# Patient Record
Sex: Male | Born: 1945 | Race: White | Hispanic: No | Marital: Married | Smoking: Current every day smoker
Health system: Southern US, Community
[De-identification: ages and names within clinical notes are randomized; demographics above are authoritative.]

## PROBLEM LIST (undated history)

## (undated) DIAGNOSIS — Z973 Presence of spectacles and contact lenses: Secondary | ICD-10-CM

## (undated) DIAGNOSIS — H9190 Unspecified hearing loss, unspecified ear: Secondary | ICD-10-CM

## (undated) DIAGNOSIS — T8859XA Other complications of anesthesia, initial encounter: Secondary | ICD-10-CM

## (undated) DIAGNOSIS — N200 Calculus of kidney: Secondary | ICD-10-CM

## (undated) DIAGNOSIS — T4145XA Adverse effect of unspecified anesthetic, initial encounter: Secondary | ICD-10-CM

## (undated) HISTORY — PX: OTHER SURGICAL HISTORY: SHX169

## (undated) HISTORY — PX: BACK SURGERY: SHX140

## (undated) HISTORY — PX: CERVICAL FUSION: SHX112

## (undated) HISTORY — PX: LASIK: SHX215

## (undated) HISTORY — PX: JOINT REPLACEMENT: SHX530

## (undated) HISTORY — PX: COLONOSCOPY W/ BIOPSIES AND POLYPECTOMY: SHX1376

## (undated) HISTORY — PX: APPENDECTOMY: SHX54

---

## 2010-01-22 ENCOUNTER — Ambulatory Visit (HOSPITAL_COMMUNITY): Payer: Self-pay | Admitting: Psychology

## 2010-02-05 ENCOUNTER — Ambulatory Visit (HOSPITAL_COMMUNITY): Payer: Self-pay | Admitting: Psychology

## 2012-10-21 ENCOUNTER — Encounter: Payer: Self-pay | Admitting: Emergency Medicine

## 2012-10-21 ENCOUNTER — Emergency Department (INDEPENDENT_AMBULATORY_CARE_PROVIDER_SITE_OTHER)
Admission: EM | Admit: 2012-10-21 | Discharge: 2012-10-21 | Disposition: A | Discharge status: Home / Self Care | Payer: Medicare Other | Source: Home / Self Care | Attending: Family Medicine | Admitting: Family Medicine

## 2012-10-21 DIAGNOSIS — S51809A Unspecified open wound of unspecified forearm, initial encounter: Secondary | ICD-10-CM

## 2012-10-21 DIAGNOSIS — S40022A Contusion of left upper arm, initial encounter: Secondary | ICD-10-CM

## 2012-10-21 DIAGNOSIS — S40029A Contusion of unspecified upper arm, initial encounter: Secondary | ICD-10-CM

## 2012-10-21 DIAGNOSIS — IMO0002 Reserved for concepts with insufficient information to code with codable children: Secondary | ICD-10-CM
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
## 2014-05-03 ENCOUNTER — Encounter (HOSPITAL_COMMUNITY): Payer: Self-pay

## 2014-05-03 ENCOUNTER — Encounter (HOSPITAL_COMMUNITY)
Admission: RE | Admit: 2014-05-03 | Discharge: 2014-05-03 | Disposition: A | Discharge status: Home / Self Care | Payer: Medicare Other | Source: Ambulatory Visit | Attending: Orthopedic Surgery | Admitting: Orthopedic Surgery

## 2014-05-03 DIAGNOSIS — M961 Postlaminectomy syndrome, not elsewhere classified: Secondary | ICD-10-CM | POA: Diagnosis not present

## 2014-05-03 DIAGNOSIS — F17210 Nicotine dependence, cigarettes, uncomplicated: Secondary | ICD-10-CM | POA: Diagnosis not present

## 2014-05-03 DIAGNOSIS — Y838 Other surgical procedures as the cause of abnormal reaction of the patient, or of later complication, without mention of misadventure at the time of the procedure: Secondary | ICD-10-CM | POA: Diagnosis not present

## 2014-05-03 DIAGNOSIS — Z01812 Encounter for preprocedural laboratory examination: Secondary | ICD-10-CM | POA: Diagnosis not present

## 2014-05-03 DIAGNOSIS — Z981 Arthrodesis status: Secondary | ICD-10-CM | POA: Diagnosis not present

## 2014-05-03 DIAGNOSIS — R05 Cough: Secondary | ICD-10-CM | POA: Diagnosis not present

## 2014-05-03 DIAGNOSIS — R0683 Snoring: Secondary | ICD-10-CM | POA: Diagnosis not present

## 2014-05-03 DIAGNOSIS — Z01818 Encounter for other preprocedural examination: Secondary | ICD-10-CM | POA: Diagnosis not present

## 2014-05-03 DIAGNOSIS — R0600 Dyspnea, unspecified: Secondary | ICD-10-CM | POA: Diagnosis not present

## 2014-05-03 DIAGNOSIS — G894 Chronic pain syndrome: Secondary | ICD-10-CM | POA: Diagnosis present

## 2014-05-03 HISTORY — DX: Calculus of kidney: N20.0

## 2014-05-03 HISTORY — DX: Other complications of anesthesia, initial encounter: T88.59XA

## 2014-05-03 HISTORY — DX: Presence of spectacles and contact lenses: Z97.3

## 2014-05-03 HISTORY — DX: Adverse effect of unspecified anesthetic, initial encounter: T41.45XA

## 2014-05-03 HISTORY — DX: Unspecified hearing loss, unspecified ear: H91.90

## 2014-05-03 LAB — BASIC METABOLIC PANEL
ANION GAP: 8 (ref 5–15)
BUN: 12 mg/dL (ref 6–23)
CO2: 25 mmol/L (ref 19–32)
Calcium: 8.9 mg/dL (ref 8.4–10.5)
Chloride: 106 mmol/L (ref 96–112)
Creatinine, Ser: 1.52 mg/dL — ABNORMAL HIGH (ref 0.50–1.35)
GFR, EST AFRICAN AMERICAN: 53 mL/min — AB (ref >90)
GFR, EST NON AFRICAN AMERICAN: 46 mL/min — AB (ref >90)
Glucose, Bld: 110 mg/dL — ABNORMAL HIGH (ref 70–99)
POTASSIUM: 4.4 mmol/L (ref 3.5–5.1)
SODIUM: 139 mmol/L (ref 135–145)

## 2014-05-03 LAB — CBC
HCT: 37.7 % — ABNORMAL LOW (ref 39.0–52.0)
Hemoglobin: 12.8 g/dL — ABNORMAL LOW (ref 13.0–17.0)
MCH: 32.3 pg (ref 26.0–34.0)
MCHC: 34 g/dL (ref 30.0–36.0)
MCV: 95.2 fL (ref 78.0–100.0)
Platelets: 136 10*3/uL — ABNORMAL LOW (ref 150–400)
RBC: 3.96 MIL/uL — ABNORMAL LOW (ref 4.22–5.81)
RDW: 13.2 % (ref 11.5–15.5)
WBC: 5.2 10*3/uL (ref 4.0–10.5)

## 2014-05-03 LAB — SURGICAL PCR SCREEN
MRSA, PCR: NEGATIVE
STAPHYLOCOCCUS AUREUS: NEGATIVE

## 2014-05-03 MED ORDER — ACETAMINOPHEN 10 MG/ML IV SOLN
1000.0000 mg | Freq: Four times a day (QID) | INTRAVENOUS | Status: DC
  Administered 2014-05-04: 1000 mg via INTRAVENOUS

## 2014-05-03 MED ORDER — CEFAZOLIN SODIUM-DEXTROSE 2-3 GM-% IV SOLR
2.0000 g | INTRAVENOUS | Status: AC
  Administered 2014-05-04: 2 g via INTRAVENOUS
  Filled 2014-05-03: qty 50

## 2014-05-03 MED ORDER — DEXAMETHASONE SODIUM PHOSPHATE 4 MG/ML IJ SOLN
4.0000 mg | Freq: Once | INTRAMUSCULAR | Status: DC
  Filled 2014-05-03: qty 1

## 2014-05-03 NOTE — Progress Notes (Signed)
Pt PCR results from this AM still pending; called lab. Please treat pt DOS if positive.

## 2014-05-03 NOTE — Progress Notes (Signed)
Pt denies SOB, chest pain, and being under the care of a cardiologist. Pt stated that a stress test was done but he can't remember where and when; records were requested from PCP Julie SwazilandJordan , [facility]. Revonda StandardAllison, PA ( anesthesia) made aware that pt stated that he cannot move his neck from left to right greater than 20 degrees and that Dr. Shon BatonBrooks took an x ray of the neck because the hardware from a cervical fusion rubs on his esophagus. See clearance and documentation on chart.

## 2014-05-03 NOTE — Progress Notes (Signed)
Anesthesia Chart Review:  Patient is a 69 year old male scheduled for lumbar spinal cord stimulator insertion on 05/04/14 by Dr. Shon BatonBrooks.  History includes smoking, hard of hearing, ACDF/cervial fusion X 3, back surgery, nephrolithiasis, bilateral TKA. [facility] by Julie SwazilandJordan, NP for follow-up and pre-operative evaluation and was felt acceptable risk. EKG and CXR done at that time (see below).  Anesthesia concerns include limited neck mobility particularly to the sides and also issues with dysphagia felt likely due to cervical fusion plate positioning.  He reported that Dr. Shon BatonBrooks did some imaging of his neck or esophagus.  I contacted Cordelia PenSherry at his office. She reported that there is not an official report, but that Dr. Shon BatonBrooks discussed results in his 04/25/14 office note.  It stated, "X-rays today do not show any significant change from 3 years ago but the inferior aspect of his plate has migrated off. He has had multiple swallow studies for chronic dysphasia, most likely due to the positioning of his plate. 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 Normal ECG.  04/27/14 CXR: FINDINGS: Compare 01/11/13: Normal heart size. 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 No new infiltrate or effusion. Prior resection left distal clavicle and cervical spine fusion. IMPRESSION: No acute changes. 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 Evidence of prior surgery.   03/15/12 MRI c-spine (Care Everywhere):  FINDINGS: The patient is status post ACDF C3-C6. Posterior fusion C1 and C2. The spinal cord appears normal. The visualized posterior fossa is unremarkable. The cord is visualized to the T3 level. Artifact from the hardware mildly degrades the image quality. The foramen magnum have a normal appearance. Axial imaging C2-C3: No evidence of cord compression. Also, no obvious  nerve root compression. C3-C4: There is little in the way of remaining disc material. Posterior lateral osteophytes are present, right greater than left with bilateral foraminal encroachment. C4-C5: No significant residual disc material is seen. There is severe foraminal stenosis on the right from osteophyte formation.  C5-C6: Again, little residual disc material is seen status post fusion. Severe right foraminal encroachment from osteophyte formation. C6-C7: No significant abnormality. C7-T1: Negative T1-T2: Negative  IMPRESSION: The main abnormality is severe foraminal encroachment on the right, C3-C4 through C5-C6. There is no evidence of cord compression.   Preoperative labs noted. Cr 1.52 (Previous Cr was 1.45 on 01/20/14 in Care Everywhere). H/H 12.8/37.7, PLY 136K.  Due to limited neck mobility and c-spine anatomy he may need his neck in a neutral position with use of advanced airway techniques.  Definitive anesthesia plan following anesthesiologist evaluation on the day of surgery.  Velna Ochsllison Aija Scarfo, PA-C [facility]iology Phone [phone] 05/03/2014 12:09 PM

## 2014-05-03 NOTE — Anesthesia Preprocedure Evaluation (Addendum)
Anesthesia Evaluation  Patient identified by MRN, date of birth, ID band Patient awake    Reviewed: Allergy & Precautions, NPO status , Patient's Chart, lab work & pertinent test results  Airway        Dental   Pulmonary Current Smoker (25 pack year),          Cardiovascular negative cardio ROS      Neuro/Psych    GI/Hepatic negative GI ROS, Neg liver ROS,   Endo/Other  negative endocrine ROS  Renal/GU Creat 1.5     Musculoskeletal   Abdominal   Peds  Hematology 12/37   Anesthesia Other Findings   Reproductive/Obstetrics                           Anesthesia Physical Anesthesia Plan  ASA: II  Anesthesia Plan: General   Post-op Pain Management:    Induction: Intravenous  Airway Management Planned: Oral ETT and Video Laryngoscope Planned  Additional Equipment:   Intra-op Plan:   Post-operative Plan:   Informed Consent: I have reviewed the patients History and Physical, chart, labs and discussed the procedure including the risks, benefits and alternatives for the proposed anesthesia with the patient or authorized representative who has indicated his/her understanding and acceptance.     Plan Discussed with:   Anesthesia Plan Comments: (See my note regarding anesthesia concerns. Shonna ChockAllison Zelenak, PA-C, multimodal pain RX,  Has reported some reduced neck mobility, Glide available)      Anesthesia Quick Evaluation

## 2014-05-04 ENCOUNTER — Observation Stay (HOSPITAL_COMMUNITY)
Admission: RE | Admit: 2014-05-04 | Discharge: 2014-05-05 | Disposition: A | Discharge status: Home / Self Care | Payer: Medicare Other | Source: Ambulatory Visit | Attending: Orthopedic Surgery | Admitting: Orthopedic Surgery

## 2014-05-04 ENCOUNTER — Inpatient Hospital Stay (HOSPITAL_COMMUNITY): Payer: Medicare Other | Admitting: Anesthesiology

## 2014-05-04 ENCOUNTER — Encounter (HOSPITAL_COMMUNITY)
Admission: RE | Disposition: A | Discharge status: Home / Self Care | Payer: Medicare Other | Source: Ambulatory Visit | Attending: Orthopedic Surgery

## 2014-05-04 ENCOUNTER — Inpatient Hospital Stay (HOSPITAL_COMMUNITY): Payer: Medicare Other | Admitting: Vascular Surgery

## 2014-05-04 ENCOUNTER — Inpatient Hospital Stay (HOSPITAL_COMMUNITY): Payer: Medicare Other

## 2014-05-04 ENCOUNTER — Encounter (HOSPITAL_COMMUNITY): Payer: Self-pay

## 2014-05-04 DIAGNOSIS — R0683 Snoring: Secondary | ICD-10-CM | POA: Insufficient documentation

## 2014-05-04 DIAGNOSIS — M961 Postlaminectomy syndrome, not elsewhere classified: Principal | ICD-10-CM | POA: Insufficient documentation

## 2014-05-04 DIAGNOSIS — Z01818 Encounter for other preprocedural examination: Secondary | ICD-10-CM | POA: Insufficient documentation

## 2014-05-04 DIAGNOSIS — Z01812 Encounter for preprocedural laboratory examination: Secondary | ICD-10-CM | POA: Insufficient documentation

## 2014-05-04 DIAGNOSIS — Y838 Other surgical procedures as the cause of abnormal reaction of the patient, or of later complication, without mention of misadventure at the time of the procedure: Secondary | ICD-10-CM | POA: Insufficient documentation

## 2014-05-04 DIAGNOSIS — Z981 Arthrodesis status: Secondary | ICD-10-CM | POA: Diagnosis not present

## 2014-05-04 DIAGNOSIS — G894 Chronic pain syndrome: Secondary | ICD-10-CM | POA: Insufficient documentation

## 2014-05-04 DIAGNOSIS — G8929 Other chronic pain: Secondary | ICD-10-CM | POA: Diagnosis present

## 2014-05-04 DIAGNOSIS — F17210 Nicotine dependence, cigarettes, uncomplicated: Secondary | ICD-10-CM | POA: Insufficient documentation

## 2014-05-04 DIAGNOSIS — Z419 Encounter for procedure for purposes other than remedying health state, unspecified: Secondary | ICD-10-CM

## 2014-05-04 DIAGNOSIS — R0600 Dyspnea, unspecified: Secondary | ICD-10-CM | POA: Insufficient documentation

## 2014-05-04 DIAGNOSIS — R05 Cough: Secondary | ICD-10-CM | POA: Insufficient documentation

## 2014-05-04 HISTORY — PX: SPINAL CORD STIMULATOR INSERTION: SHX5378

## 2014-05-04 SURGERY — INSERTION, SPINAL CORD STIMULATOR, LUMBAR
Anesthesia: General | Site: Back

## 2014-05-04 MED ORDER — PROPOFOL 10 MG/ML IV BOLUS
INTRAVENOUS | Status: DC | PRN
  Administered 2014-05-04: 200 mg via INTRAVENOUS

## 2014-05-04 MED ORDER — DEXAMETHASONE SODIUM PHOSPHATE 4 MG/ML IJ SOLN
4.0000 mg | Freq: Four times a day (QID) | INTRAMUSCULAR | Status: DC
  Filled 2014-05-04 (×4): qty 1

## 2014-05-04 MED ORDER — SUCCINYLCHOLINE CHLORIDE 20 MG/ML IJ SOLN
INTRAMUSCULAR | Status: DC | PRN
  Administered 2014-05-04: 120 mg via INTRAVENOUS

## 2014-05-04 MED ORDER — MEPERIDINE HCL 25 MG/ML IJ SOLN: 6.2500 mg | INTRAMUSCULAR | Status: DC | PRN

## 2014-05-04 MED ORDER — LACTATED RINGERS IV SOLN
INTRAVENOUS | Status: DC
  Administered 2014-05-04: 12:00:00 via INTRAVENOUS

## 2014-05-04 MED ORDER — ONDANSETRON HCL 4 MG/2ML IJ SOLN
INTRAMUSCULAR | Status: DC | PRN
  Administered 2014-05-04 (×2): 4 mg via INTRAVENOUS

## 2014-05-04 MED ORDER — CEFAZOLIN SODIUM 1-5 GM-% IV SOLN
1.0000 g | Freq: Three times a day (TID) | INTRAVENOUS | Status: AC
  Administered 2014-05-04 – 2014-05-05 (×2): 1 g via INTRAVENOUS
  Filled 2014-05-04 (×2): qty 50

## 2014-05-04 MED ORDER — MENTHOL 3 MG MT LOZG: 1.0000 | LOZENGE | OROMUCOSAL | Status: DC | PRN

## 2014-05-04 MED ORDER — MIDAZOLAM HCL 2 MG/2ML IJ SOLN
INTRAMUSCULAR | Status: AC
  Filled 2014-05-04: qty 2

## 2014-05-04 MED ORDER — HEMOSTATIC AGENTS (NO CHARGE) OPTIME
TOPICAL | Status: DC | PRN
  Administered 2014-05-04: 1 via TOPICAL

## 2014-05-04 MED ORDER — FENTANYL CITRATE 0.05 MG/ML IJ SOLN: 25.0000 ug | INTRAMUSCULAR | Status: DC | PRN

## 2014-05-04 MED ORDER — SODIUM CHLORIDE 0.9 % IV SOLN: 250.0000 mL | INTRAVENOUS | Status: DC

## 2014-05-04 MED ORDER — ARTIFICIAL TEARS OP OINT
TOPICAL_OINTMENT | OPHTHALMIC | Status: DC | PRN
  Administered 2014-05-04: 1 via OPHTHALMIC

## 2014-05-04 MED ORDER — PROMETHAZINE HCL 25 MG/ML IJ SOLN: 6.2500 mg | INTRAMUSCULAR | Status: DC | PRN

## 2014-05-04 MED ORDER — FENTANYL CITRATE 0.05 MG/ML IJ SOLN
INTRAMUSCULAR | Status: DC | PRN
  Administered 2014-05-04: 50 ug via INTRAVENOUS
  Administered 2014-05-04: 100 ug via INTRAVENOUS
  Administered 2014-05-04 (×2): 50 ug via INTRAVENOUS

## 2014-05-04 MED ORDER — ACETAMINOPHEN 10 MG/ML IV SOLN
1000.0000 mg | Freq: Four times a day (QID) | INTRAVENOUS | Status: DC
  Administered 2014-05-04 – 2014-05-05 (×3): 1000 mg via INTRAVENOUS
  Filled 2014-05-04 (×4): qty 100

## 2014-05-04 MED ORDER — THROMBIN 20000 UNITS EX KIT
PACK | CUTANEOUS | Status: DC | PRN
  Administered 2014-05-04: 20 mL via TOPICAL

## 2014-05-04 MED ORDER — SODIUM CHLORIDE 0.9 % IJ SOLN
3.0000 mL | Freq: Two times a day (BID) | INTRAMUSCULAR | Status: DC
  Administered 2014-05-04: 3 mL via INTRAVENOUS

## 2014-05-04 MED ORDER — LACTATED RINGERS IV SOLN
INTRAVENOUS | Status: DC | PRN
  Administered 2014-05-04 (×2): via INTRAVENOUS

## 2014-05-04 MED ORDER — MIDAZOLAM HCL 5 MG/5ML IJ SOLN
INTRAMUSCULAR | Status: DC | PRN
  Administered 2014-05-04: 2 mg via INTRAVENOUS

## 2014-05-04 MED ORDER — THROMBIN 20000 UNITS EX SOLR
CUTANEOUS | Status: AC
  Filled 2014-05-04: qty 20000

## 2014-05-04 MED ORDER — PHENOL 1.4 % MT LIQD: 1.0000 | OROMUCOSAL | Status: DC | PRN

## 2014-05-04 MED ORDER — BUPIVACAINE-EPINEPHRINE 0.25% -1:200000 IJ SOLN
INTRAMUSCULAR | Status: DC | PRN
  Administered 2014-05-04: 10 mL

## 2014-05-04 MED ORDER — QUETIAPINE FUMARATE 100 MG PO TABS
100.0000 mg | ORAL_TABLET | Freq: Every day | ORAL | Status: DC
  Administered 2014-05-04: 100 mg via ORAL
  Filled 2014-05-04 (×2): qty 1

## 2014-05-04 MED ORDER — DEXAMETHASONE SODIUM PHOSPHATE 4 MG/ML IJ SOLN
INTRAMUSCULAR | Status: DC | PRN
  Administered 2014-05-04: 4 mg via INTRAVENOUS

## 2014-05-04 MED ORDER — LIDOCAINE HCL (CARDIAC) 20 MG/ML IV SOLN
INTRAVENOUS | Status: DC | PRN
  Administered 2014-05-04: 50 mg via INTRAVENOUS
  Administered 2014-05-04: 100 mg via INTRAVENOUS

## 2014-05-04 MED ORDER — DEXMEDETOMIDINE BOLUS VIA INFUSION
INTRAVENOUS | Status: DC | PRN
  Administered 2014-05-04: 4 ug via INTRAVENOUS
  Administered 2014-05-04: 10 ug via INTRAVENOUS

## 2014-05-04 MED ORDER — DEXAMETHASONE 4 MG PO TABS
4.0000 mg | ORAL_TABLET | Freq: Four times a day (QID) | ORAL | Status: DC
Start: 2014-05-04 — End: 2014-05-05
  Administered 2014-05-04 – 2014-05-05 (×3): 4 mg via ORAL
  Filled 2014-05-04 (×7): qty 1

## 2014-05-04 MED ORDER — NEOSTIGMINE METHYLSULFATE 10 MG/10ML IV SOLN
INTRAVENOUS | Status: DC | PRN
  Administered 2014-05-04: 5 mg via INTRAVENOUS

## 2014-05-04 MED ORDER — FENTANYL CITRATE 0.05 MG/ML IJ SOLN
INTRAMUSCULAR | Status: AC
  Filled 2014-05-04: qty 5

## 2014-05-04 MED ORDER — ONDANSETRON HCL 4 MG/2ML IJ SOLN: 4.0000 mg | INTRAMUSCULAR | Status: DC | PRN

## 2014-05-04 MED ORDER — GLYCOPYRROLATE 0.2 MG/ML IJ SOLN
INTRAMUSCULAR | Status: DC | PRN
  Administered 2014-05-04: 0.2 mg via INTRAVENOUS
  Administered 2014-05-04: 0.6 mg via INTRAVENOUS

## 2014-05-04 MED ORDER — METHOCARBAMOL 500 MG PO TABS: 500.0000 mg | ORAL_TABLET | Freq: Three times a day (TID) | ORAL | Status: AC | PRN

## 2014-05-04 MED ORDER — BUPIVACAINE-EPINEPHRINE (PF) 0.25% -1:200000 IJ SOLN
INTRAMUSCULAR | Status: AC
  Filled 2014-05-04: qty 30

## 2014-05-04 MED ORDER — METHOCARBAMOL 500 MG PO TABS
500.0000 mg | ORAL_TABLET | Freq: Four times a day (QID) | ORAL | Status: DC | PRN
  Administered 2014-05-04: 500 mg via ORAL
  Filled 2014-05-04: qty 1

## 2014-05-04 MED ORDER — SODIUM CHLORIDE 0.9 % IJ SOLN: 3.0000 mL | INTRAMUSCULAR | Status: DC | PRN

## 2014-05-04 MED ORDER — METHOCARBAMOL 1000 MG/10ML IJ SOLN
500.0000 mg | Freq: Four times a day (QID) | INTRAVENOUS | Status: DC | PRN
  Filled 2014-05-04: qty 5

## 2014-05-04 MED ORDER — ACETAMINOPHEN 10 MG/ML IV SOLN
INTRAVENOUS | Status: AC
  Filled 2014-05-04: qty 100

## 2014-05-04 MED ORDER — EPHEDRINE SULFATE 50 MG/ML IJ SOLN
INTRAMUSCULAR | Status: DC | PRN
  Administered 2014-05-04 (×2): 10 mg via INTRAVENOUS

## 2014-05-04 MED ORDER — PHENYLEPHRINE HCL 10 MG/ML IJ SOLN
INTRAMUSCULAR | Status: DC | PRN
  Administered 2014-05-04: 80 ug via INTRAVENOUS

## 2014-05-04 MED ORDER — LACTATED RINGERS IV SOLN: INTRAVENOUS | Status: DC

## 2014-05-04 MED ORDER — OXYCODONE-ACETAMINOPHEN 10-325 MG PO TABS: 1.0000 | ORAL_TABLET | ORAL | Status: AC | PRN

## 2014-05-04 MED ORDER — OXYCODONE HCL 5 MG PO TABS
10.0000 mg | ORAL_TABLET | ORAL | Status: DC | PRN
  Administered 2014-05-04: 10 mg via ORAL
  Filled 2014-05-04: qty 2

## 2014-05-04 MED ORDER — ONDANSETRON HCL 4 MG PO TABS: 4.0000 mg | ORAL_TABLET | Freq: Three times a day (TID) | ORAL | Status: AC | PRN

## 2014-05-04 MED ORDER — ROCURONIUM BROMIDE 100 MG/10ML IV SOLN
INTRAVENOUS | Status: DC | PRN
  Administered 2014-05-04: 50 mg via INTRAVENOUS

## 2014-05-04 SURGICAL SUPPLY — 63 items
CANISTER SUCTION 2500CC (MISCELLANEOUS) ×3 IMPLANT
CLOSURE STERI-STRIP 1/2X4 (GAUZE/BANDAGES/DRESSINGS) ×1
CLSR STERI-STRIP ANTIMIC 1/2X4 (GAUZE/BANDAGES/DRESSINGS) ×2 IMPLANT
CORDS BIPOLAR (ELECTRODE) ×3 IMPLANT
COVER PROBE W GEL 5X96 (DRAPES) ×2 IMPLANT
COVER SURGICAL LIGHT HANDLE (MISCELLANEOUS) ×2 IMPLANT
DRAPE C-ARM 42X72 X-RAY (DRAPES) ×3 IMPLANT
DRAPE C-ARMOR (DRAPES) ×3 IMPLANT
DRAPE INCISE IOBAN 85X60 (DRAPES) ×3 IMPLANT
DRAPE SURG 17X23 STRL (DRAPES) ×3 IMPLANT
DRAPE U-SHAPE 47X51 STRL (DRAPES) ×3 IMPLANT
DRSG MEPILEX BORDER 4X4 (GAUZE/BANDAGES/DRESSINGS) ×3 IMPLANT
DRSG MEPILEX BORDER 4X8 (GAUZE/BANDAGES/DRESSINGS) ×3 IMPLANT
DURAPREP 26ML APPLICATOR (WOUND CARE) ×3 IMPLANT
ELECT BLADE 6.5 EXT (BLADE) ×3 IMPLANT
ELECT CAUTERY BLADE 6.4 (BLADE) ×3 IMPLANT
ELECT PENCIL ROCKER SW 15FT (MISCELLANEOUS) ×3 IMPLANT
ELECT REM PT RETURN 9FT ADLT (ELECTROSURGICAL) ×3
ELECTRODE REM PT RTRN 9FT ADLT (ELECTROSURGICAL) ×1 IMPLANT
GLOVE BIO SURGEON STRL SZ 6.5 (GLOVE) ×1 IMPLANT
GLOVE BIO SURGEONS STRL SZ 6.5 (GLOVE) ×1
GLOVE BIOGEL PI IND STRL 6.5 (GLOVE) IMPLANT
GLOVE BIOGEL PI IND STRL 8.5 (GLOVE) ×1 IMPLANT
GLOVE BIOGEL PI INDICATOR 6.5 (GLOVE) ×4
GLOVE BIOGEL PI INDICATOR 8.5 (GLOVE) ×2
GLOVE SS N UNI LF 8.5 STRL (GLOVE) ×6 IMPLANT
GOWN STRL REUS W/TWL 2XL LVL3 (GOWN DISPOSABLE) ×3 IMPLANT
IPG PRECISION SPECTRA (Stimulator) ×2 IMPLANT
KIT BASIN OR (CUSTOM PROCEDURE TRAY) ×3 IMPLANT
KIT CHARGING (KITS) ×2
KIT CHARGING PRECISION NEURO (KITS) IMPLANT
KIT REMOTE CONTROL PRECISION (KITS) ×2 IMPLANT
KIT ROOM TURNOVER OR (KITS) ×3 IMPLANT
LEAD PADDLE COVEREDGE 70CM (Lead) ×2 IMPLANT
NDL SPNL 18GX3.5 QUINCKE PK (NEEDLE) ×3 IMPLANT
NDL SUT 6 .5 CRC .975X.05 MAYO (NEEDLE) ×1 IMPLANT
NEEDLE 22X1 1/2 (OR ONLY) (NEEDLE) ×3 IMPLANT
NEEDLE MAYO TAPER (NEEDLE) ×3
NEEDLE SPNL 18GX3.5 QUINCKE PK (NEEDLE) ×9 IMPLANT
NS IRRIG 1000ML POUR BTL (IV SOLUTION) ×3 IMPLANT
PACK LAMINECTOMY ORTHO (CUSTOM PROCEDURE TRAY) ×3 IMPLANT
PACK UNIVERSAL I (CUSTOM PROCEDURE TRAY) ×3 IMPLANT
PAD ARMBOARD 7.5X6 YLW CONV (MISCELLANEOUS) ×6 IMPLANT
PADDLE BLANK SIMULATOR LEAD 32 (MISCELLANEOUS) ×2 IMPLANT
PADDLE LEAD IPG (Lead) ×4 IMPLANT
SPONGE LAP 4X18 X RAY DECT (DISPOSABLE) IMPLANT
SPONGE SURGIFOAM ABS GEL 100 (HEMOSTASIS) ×3 IMPLANT
STAPLER VISISTAT 35W (STAPLE) ×3 IMPLANT
SURGIFLO TRUKIT (HEMOSTASIS) ×3 IMPLANT
SUT BONE WAX W31G (SUTURE) ×3 IMPLANT
SUT FIBERWIRE #2 38 REV NDL BL (SUTURE) ×3
SUT MNCRL AB 3-0 PS2 27 (SUTURE) ×4 IMPLANT
SUT VIC AB 1 CT1 18XCR BRD 8 (SUTURE) ×1 IMPLANT
SUT VIC AB 1 CT1 27 (SUTURE) ×6
SUT VIC AB 1 CT1 27XBRD ANBCTR (SUTURE) ×1 IMPLANT
SUT VIC AB 1 CT1 8-18 (SUTURE) ×3
SUT VIC AB 2-0 CT1 18 (SUTURE) ×5 IMPLANT
SUTURE FIBERWR#2 38 REV NDL BL (SUTURE) ×1 IMPLANT
SYR CONTROL 10ML LL (SYRINGE) ×3 IMPLANT
TOOL LONG TUNNEL (SPINAL CORD STIMULATOR) ×2 IMPLANT
TOWEL OR 17X24 6PK STRL BLUE (TOWEL DISPOSABLE) ×3 IMPLANT
TOWEL OR 17X26 10 PK STRL BLUE (TOWEL DISPOSABLE) ×3 IMPLANT
WATER STERILE IRR 1000ML POUR (IV SOLUTION) ×3 IMPLANT

## 2014-05-04 NOTE — Brief Op Note (Signed)
05/04/2014  3:05 PM  PATIENT:  Baldo DaubPaul Mayeda  69 y.o. male  PRE-OPERATIVE DIAGNOSIS:  CHRONIC PAIN/FAILED BACK SYNDROME  POST-OPERATIVE DIAGNOSIS:  CHRONIC PAIN/FAILED BACK SYNDROME  PROCEDURE:  Procedure(s): LUMBAR SPINAL CORD STIMULATOR INSERTION (N/A)  SURGEON:  Surgeon(s) and Role:    * Venita Lickahari Kynisha Memon, MD - Primary  PHYSICIAN ASSISTANT:   ASSISTANTS: none   ANESTHESIA:   general  EBL:  Total I/O In: 1000 [I.V.:1000] Out: -   BLOOD ADMINISTERED:none  DRAINS: none   LOCAL MEDICATIONS USED:  MARCAINE     SPECIMEN:  No Specimen  DISPOSITION OF SPECIMEN:  N/A  COUNTS:  YES  TOURNIQUET:  * No tourniquets in log *  DICTATION: .Other Dictation: Dictation Number [id number]  PLAN OF CARE: Admit for overnight observation  PATIENT DISPOSITION:  PACU - hemodynamically stable.

## 2014-05-04 NOTE — Transfer of Care (Signed)
Immediate Anesthesia Transfer of Care Note  Patient: Adam Carroll  Procedure(s) Performed: Procedure(s): LUMBAR SPINAL CORD STIMULATOR INSERTION (N/A)  Patient Location: PACU  Anesthesia Type:General  Level of Consciousness: awake, oriented, sedated, patient cooperative and responds to stimulation  Airway & Oxygen Therapy: Patient Spontanous Breathing and Patient connected to nasal cannula oxygen  Post-op Assessment: Report given to RN, Post -op Vital signs reviewed and stable, Patient moving all extremities and Patient moving all extremities X 4  Post vital signs: Reviewed and stable  Last Vitals:  Filed Vitals:   05/04/14 1515  BP:   Pulse:   Temp: 36.5 C  Resp:     Complications: No apparent anesthesia complications

## 2014-05-04 NOTE — Anesthesia Procedure Notes (Signed)
Procedure Name: Intubation Date/Time: 05/04/2014 12:24 PM Performed by: Wray KearnsFOLEY, Aaric Dolph A Pre-anesthesia Checklist: Patient identified, Timeout performed, Emergency Drugs available, Suction available and Patient being monitored Patient Re-evaluated:Patient Re-evaluated prior to inductionOxygen Delivery Method: Circle system utilized Preoxygenation: Pre-oxygenation with 100% oxygen Intubation Type: IV induction and Cricoid Pressure applied Ventilation: Mask ventilation without difficulty Laryngoscope Size: Mac and 4 Grade View: Grade I Tube type: Oral Tube size: 7.0 mm Number of attempts: 1 Airway Equipment and Method: Stylet Placement Confirmation: ETT inserted through vocal cords under direct vision,  breath sounds checked- equal and bilateral and positive ETCO2 Secured at: 23 cm Tube secured with: Tape Dental Injury: Teeth and Oropharynx as per pre-operative assessment

## 2014-05-04 NOTE — H&P (Signed)
History of Present Illness  The patient is a 69 year old male who presents with back pain. The patient is here today He is here for discussion of surgicval Implantation of Permanent Spinal Cord Stim ([facility]). The patient reports low back (left buttock and leg) symptoms including pain, low back pain and burning which began year(s) (since 2007 or 2008, increased pain for 2 years) ago without any known injury (did have an injury in 42 while [place]). and Symptoms do not include numbness, tingling, incontinence of stool or incontinence of urine. The patient describes the severity of their symptoms as 7 / 10 on an analog pain scale. The patient feels as if the symptoms are worsening (did have decreased pain with trial SCS). Current treatment includes opioid analgesics and activity modification. Prior to being seen today the patient was previously evaluated by an out of town physician (Dr Windy Kalata). Past evaluation has included pain management evaluation. Past treatment has included epidural injections, back surgery (2007 Dr Nigel Bridgeman in [place]) and trial SCS. Note for "Back pain": The patient is in to discuss perm SCS placement.  Additional reasons for visit:  Transition into care is described as the following: The patient is transitioning into care from another physician (Dr Windy Kalata at [facility] in [place]) .  Subjective Transcription He presents today on referral from Dr. Althea Charon for a spinal cord stimulator implant. The patient has had multiple spinal surgeries including a posterior C1-2 fusion. He's had an anterior 3-7 fusion. 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 He has been under the care of Dr. Vear Clock for some time and ultimately recently had a spinal cord stimulator trial with near complete relief of his neuropathic left leg pain. He presents to me today for evaluation for a permanent  implant.  X-rays were reviewed from 2013 of the cervical spine. He actually has an unorthodox anterior cervical fixation with a 1/3 tubular plate. There is obvious migration of the plate.  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 Gabapentin (  Capsule, Oral) Active. ProAir HFA (108 (90 Base)MCG/ACT Aerosol Soln, Inhalation) Active. Medications Reconciled  Past Surgical History  Appendectomy Colon Polyp Removal - Colonoscopy Neck Disc Surgery Spinal Decompression lower back Spinal Fusion neck Spinal Surgery Total Knee Replacement bilateral  Other Problems  Kidney Stone  Review of Systems  General Not Present- Appetite Loss, Chills, Fatigue, Feeling sick, Fever, Night Sweats, Weight Gain and Weight Loss. Skin Not Present- Change in Hair or Nails, Itching, Psoriasis, Rash, Skin Color Changes and Ulcer. HEENT Present- Ringing in the Ears. Not Present- Hearing problems, Nose Bleed and Sensitivity to light. Respiratory Not Present- Bloody sputum, Chronic Cough, Dyspnea and Snoring. Cardiovascular Not Present- Chest Pain, Leg Cramps, Palpitations, Shortness of Breath and Swelling of Extremities. Gastrointestinal Not Present- Abdominal Pain, Bloody Stool, Heartburn, Incontinence of Stool, Nausea and Vomiting. Male Genitourinary Not Present- Blood in Urine, Frequency, Incontinence and  Nocturia. Musculoskeletal Present- Back Pain, Joint Pain  and Joint Stiffness. Not Present- Joint Swelling, Muscle Pain and Muscle Weakness. Neurological Not Present- Burning, Dizziness, Headaches, Numbness, Tingling and Tremor. Psychiatric Not Present- Anxiety, Depression and Memory Loss. Endocrine Not Present- Cold Intolerance, Excessive hunger, Excessive Thirst and Heat Intolerance. Hematology Present- Easy Bruising. Not Present- Abnormal Bleeding, Anemia and Blood Clots. Vitals Fannie Knee(Sue M. Toomes; 04/25/2014 10:42 AM) 04/25/2014 10:28 AM Weight: 191 lb Height: 69.5in Body Surface Area: 2.04 m Body Mass Index: 27.8 kg/m  BP: 155/83 (Sitting, Right Arm, Standard)  Objective Transcription X-rays today do not show any significant change from 3 years ago but the inferior aspect of his plate has migrated off. He has had multiple swallow studies for chronic dysphasia, most likely due to the positioning of his plate. 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  He has a negative Babinski test, no clonus, negative Hoffman's sign, 5/5 strength in the upper and lower extremities, dysesthesias and pain in the left lower extremity, well-healed surgical scar in the lumbar spine. No shortness of breath or chest pain. No loss of bowel or bladder control.  MRI of his actually thoracic spine was completed. There is mild stenosis at T7-8. Small disc protrusion without stenosis at 6-7 and 8-9. Mild disc bulge at T11-12. No contraindication for implantation of the device. He was maximum programming at T8, so we will most likely do a T9 laminectomy and advance device.  Assessment & Plan  Plans Transcription At this point in time, per my protocol, we will get an MRI of his thoracic spine to ensure that there is adequate space to implant the device. We have gone over the surgery which would be a 2-incision technique, where we make an incision in the thoracic spine, place the paddle and  secure it in position, and then make another incision either in the left or right gluteal region to place the battery. We have reviewed the risks which include infection, bleeding, nerve damage, death, stroke, paralysis, failure to heal, need for further surgery, ongoing or worse pain, loss of bowel or bladder control, migration of the lead. All of his questions were addressed. I'll see him after he gets the thoracic MRI to ensure that we have adequate positioning, but we will move forward with getting the surgical date.

## 2014-05-04 NOTE — Progress Notes (Signed)
Patient has an EKG and chest x-ray on chart from visit to PCP 04/27/2014

## 2014-05-04 NOTE — Anesthesia Postprocedure Evaluation (Signed)
  Anesthesia Post-op Note  Patient: Adam Carroll  Procedure(s) Performed: Procedure(s): LUMBAR SPINAL CORD STIMULATOR INSERTION (N/A)  Patient Location: PACU  Anesthesia Type:General  Level of Consciousness: awake  Airway and Oxygen Therapy: Patient Spontanous Breathing  Post-op Pain: mild  Post-op Assessment: Post-op Vital signs reviewed  Post-op Vital Signs: Reviewed and stable  Last Vitals:  Filed Vitals:   05/04/14 1515  BP: 157/74  Pulse: 95  Temp: 36.5 C  Resp: 16    Complications: No apparent anesthesia complications

## 2014-05-05 DIAGNOSIS — M961 Postlaminectomy syndrome, not elsewhere classified: Secondary | ICD-10-CM | POA: Diagnosis not present

## 2014-05-05 MED FILL — Thrombin For Soln 20000 Unit: CUTANEOUS | Qty: 1 | Status: AC

## 2014-05-05 NOTE — Progress Notes (Signed)
Pt doing well. Pt given D/C instructions with Rx's, verbal understanding was provided. Pt's incision is clean and dry with no sign of infection. Pt's IV was removed prior to D/C. Pt D/C'd home via wheelchair @ 0955 per MD order. Pt is stable @ D/C and has no other needs at this time. Analiya Porco, RN  

## 2014-05-05 NOTE — Op Note (Signed)
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  OPERATIVE PROCEDURE:  Implantation of spinal cord stimulator.  We used a [employer] system.  SURGEON:  Alvy Bealahari D Courteney Alderete, MD.  COMPLICATIONS:  After implantation of the initial lead, on passing one of the 4 leads was damaged and could not be placed into the battery and so the paddle lead had to be exchanged.  OTHER COMPLICATIONS:  None.  HISTORY:  This is a very pleasant 69 year old gentleman who was in his usual state of good to excellent who has had longstanding significant back diffuse pain.  He has been under the care of pain management physician.  He recently had a spinal cord stimulator trial and did quite well with it.  Subsequently, he presented to me for implantation of a spinal cord stimulator.  All appropriate risks, benefits, and alternatives were discussed with the patient.  Preoperative MRI was completed and we consented him for the aforementioned procedure.  OPERATIVE NOTE:  The patient was brought to the operating room, placed supine on the operating table.  After successful induction of general anesthesia and endotracheal intubation, TEDs and SCDs were placed.  The patient was turned prone onto the Wilson frame and all bony prominences were well padded.  The back, thoracolumbar spine was prepped and draped in a standard fashion.  Time-out was taken to confirm patient, procedure, and all other pertinent important data.  Once this was completed, the  fluoro was brought into the field to count from L5.  Once I identified the T9-10 interspace, I drew out the line and infiltrated the incision site.  I also infiltrated the battery site.  Midline thoracic incision was made starting at the midportion of the T9 spinous process and proceeding down to the T10-11 interspinous process ligament. Sharp dissection was carried out down to the deep fascia.  I incised the deep fascia and stripped the paraspinal muscles to expose the inferior portion of the T9 and all the T10 laminae.  Once I had this exposed bilaterally, a self-retaining retractor was placed.  I then counted again starting from L5 to confirm the T9 site.  At this point, I then removed the inferior aspect of the T9 spinous process; and using a 2 mm Kerrison, I performed a T9 laminotomy.  I then dissected through the ligamentum flavum with a Penfield 4 and then used my 2 mm Kerrison to resect the ligamentum flavum in the midline.  At this point, I removed the dorsal epidural fat and exposed the thecal sac.  I was able to easily pass the trial dural spatula superiorly.  I then placed the paddle without resistance.  I then sutured the paddle.  There were 4 leads, I sutured 2 directly to the spinous  process of T10.  I then looped them through the interspinous ligament and then made the battery site incision.  I then created a pocket 2.5 cm in depth.  I then passed the submuscular passing device and then passed the leads.  During the process of doing this, one of the leads was damaged.  I attempted to repair it, but it still would not pass through the battery.  At this point, my options were to just leave this lead out or to place a new paddle.  I did not feel as though leaving the paddle.  After discussing it with the sales rep, I elected to change the paddle just so the patient have the best pain coverage.  I then removed the paddle, obtained a new one and reposition it.  The second paddle  was slightly longer, but was able to easily pass without any resistance.  I then using the same technique, sutured it back down to the spinous process, wrapped through the interspinous process ligament of that at T10-11 and then passed it to the battery site.  I then secured it to the battery without problem and torqued each of the individual nuts.  I then placed the battery into the pocket and secured it to the pre-fascia with interrupted #1 Vicryl sutures.  I irrigated both wounds copiously with normal saline.  Final x-rays were satisfactory.  The paddle was properly positioned in both the AP and lateral planes.  I then closed both wounds in a similar fashion.  Deep fascia was closed with interrupted #1 Vicryl sutures, superficial with 2-0 Vicryl sutures, and 3-0 Monocryl for the skin.  Steri-Strips and dry dressing were applied.  The patient was extubated, transferred to PACU without incident.  At the end of the case, all needle and sponge counts were correct.     Alvy Beal, MD     DDB/MEDQ  D:  05/04/2014  T:  05/05/2014  Job:  [id number]

## 2014-05-05 NOTE — Progress Notes (Signed)
OT Cancellation Note  Patient Details Name: Adam Carroll MRN: [id number] DOB: 02-02-1946   Cancelled Treatment:    Reason Eval/Treat Not Completed: OT screened, no needs identified, will sign off. Per PT pt is independent and has no OT needs. Acute OT to sign off.   [name], [name] 05/05/2014, 9:53 AM

## 2014-05-05 NOTE — Evaluation (Signed)
Physical Therapy Evaluation Patient Details Name: Adam Carroll MRN: [id number] DOB: 12-21-1945 Today's Date: 05/05/2014   History of Present Illness  The patient is a 69 year old male who presents with back pain. He is s/p spinal cord stimulator implant, 05-04-14.  Clinical Impression  Pt is independent with all functional mobility.  No skilled PT intervention indicated. Pt is discharging home this morning. PT signing off.    Follow Up Recommendations No PT follow up    Equipment Recommendations  None recommended by PT    Recommendations for Other Services       Precautions / Restrictions Precautions Precautions: None      Mobility  Bed Mobility Overal bed mobility: Independent                Transfers Overall transfer level: Independent                  Ambulation/Gait Ambulation/Gait assistance: Independent Ambulation Distance (Feet): 350 Feet Assistive device: None Gait Pattern/deviations: WFL(Within Functional Limits)   Gait velocity interpretation: >2.62 ft/sec, indicative of independent community ambulator General Gait Details: steady Insurance risk surveyorgait  Stairs            Wheelchair Mobility    Modified Rankin (Stroke Patients Only)       Balance Overall balance assessment: No apparent balance deficits (not formally assessed)                                           Pertinent Vitals/Pain Pain Assessment: 0-10 Pain Score: 5  Pain Location: low back at incision Pain Descriptors / Indicators: Pressure Pain Intervention(s): Monitored during session    Home Living Family/patient expects to be discharged to:: Private residence Living Arrangements: Alone Available Help at Discharge: Family;Available PRN/intermittently Type of Home: House Home Access: Level entry     Home Layout: One level Home Equipment: None      Prior Function Level of Independence: Independent               Hand Dominance         Extremity/Trunk Assessment   Upper Extremity Assessment: Overall WFL for tasks assessed           Lower Extremity Assessment: Overall WFL for tasks assessed      Cervical / Trunk Assessment: Normal  Communication   Communication: No difficulties  Cognition Arousal/Alertness: Awake/alert Behavior During Therapy: WFL for tasks assessed/performed Overall Cognitive Status: Within Functional Limits for tasks assessed                      General Comments      Exercises        Assessment/Plan    PT Assessment Patent does not need any further PT services  PT Diagnosis Difficulty walking;Acute pain   PT Problem List    PT Treatment Interventions     PT Goals (Current goals can be found in the Care Plan section) Acute Rehab PT Goals Patient Stated Goal: home today PT Goal Formulation: All assessment and education complete, DC therapy    Frequency     Barriers to discharge        Co-evaluation               End of Session   Activity Tolerance: Patient tolerated treatment well Patient left: in chair;with call bell/phone within reach Nurse Communication: Mobility status  Functional Assessment Tool Used: clinical judgement Functional Limitation: Mobility: Walking and moving around Mobility: Walking and Moving Around Current Status [phone]): At least 1 percent but less than 20 percent impaired, limited or restricted Mobility: Walking and Moving Around Goal Status [phone]): At least 1 percent but less than 20 percent impaired, limited or restricted Mobility: Walking and Moving Around Discharge Status [phone]): At least 1 percent but less than 20 percent impaired, limited or restricted    Time: 0906-0915 PT Time Calculation (min) (ACUTE ONLY): 9 min   Charges:   PT Evaluation $Initial PT Evaluation Tier I: 1 Procedure     PT G Codes:   PT G-Codes **NOT FOR INPATIENT CLASS** Functional Assessment Tool Used: clinical judgement Functional Limitation:  Mobility: Walking and moving around Mobility: Walking and Moving Around Current Status ([id number]): At least 1 percent but less than 20 percent impaired, limited or restricted Mobility: Walking and Moving Around Goal Status [phone]): At least 1 percent but less than 20 percent impaired, limited or restricted Mobility: Walking and Moving Around Discharge Status [phone]): At least 1 percent but less than 20 percent impaired, limited or restricted    Ilda Foil 05/05/2014, 9:13 AM

## 2014-05-05 NOTE — Progress Notes (Signed)
UR completed 

## 2014-05-08 ENCOUNTER — Encounter (HOSPITAL_COMMUNITY): Payer: Self-pay | Admitting: Orthopedic Surgery

## 2014-05-18 NOTE — Discharge Summary (Signed)
Patient ID: Adam Carroll MRN: [id number] DOB/AGE: 1945/09/28 69 y.o.  Admit date: 05/04/2014 Discharge date: 05/18/2014  Admission Diagnoses:  Active Problems:   Chronic pain   Discharge Diagnoses:  Active Problems:   Chronic pain  status post Procedure(s): LUMBAR SPINAL CORD STIMULATOR INSERTION  Past Medical History  Diagnosis Date  . Wears glasses   . HOH (hard of hearing)   . Kidney stones   . Complication of anesthesia     " My esophagus rubs on the plates and screws in my neck, I can't turn my neck sideways ; I can only turn 20 degrees to the left and right."    Surgeries: Procedure(s): LUMBAR SPINAL CORD STIMULATOR INSERTION on 05/04/2014   Consultants:    Discharged Condition: Improved  Hospital Course: Ervie Mccard is an 69 y.o. male who was admitted 05/04/2014 for operative treatment of chronic pain - s/p SCS placement. Patient failed conservative treatments (please see the history and physical for the specifics) and had severe unremitting pain that affects sleep, daily activities and work/hobbies. After pre-op clearance, the patient was taken to the operating room on 05/04/2014 and underwent  Procedure(s): LUMBAR SPINAL CORD STIMULATOR INSERTION.    Patient was given perioperative antibiotics:  Anti-infectives    Start     Dose/Rate Route Frequency Ordered Stop   05/04/14 2000  ceFAZolin (ANCEF) IVPB 1 g/50 mL premix     1 g 100 mL/hr over 30 Minutes Intravenous Every 8 hours 05/04/14 1733 05/05/14 0410   05/03/14 1308  ceFAZolin (ANCEF) IVPB 2 g/50 mL premix     2 g 100 mL/hr over 30 Minutes Intravenous 30 min pre-op 05/03/14 1308 05/04/14 1235       Patient was given sequential compression devices and early ambulation to prevent DVT.   Patient benefited maximally from hospital stay and there were no complications. At the time of discharge, the patient was urinating/moving their bowels without difficulty, tolerating a regular diet, pain is  controlled with oral pain medications and they have been cleared by PT/OT.   Recent vital signs: No data found.    Recent laboratory studies: No results for input(s): WBC, HGB, HCT, PLT, NA, K, CL, CO2, BUN, CREATININE, GLUCOSE, INR, CALCIUM in the last 72 hours.  Invalid input(s): PT, 2   Discharge Medications:     Medication List    STOP taking these medications        HYDROcodone-acetaminophen 10-325 MG per tablet  Commonly known as:  NORCO     naproxen 500 MG tablet  Commonly known as:  NAPROSYN      TAKE these medications        magnesium oxide 400 MG tablet  Commonly known as:  MAG-OX  Take 400 mg by mouth daily.     methocarbamol 500 MG tablet  Commonly known as:  ROBAXIN  Take 1 tablet (500 mg total) by mouth 3 (three) times daily as needed for muscle spasms.     ondansetron 4 MG tablet  Commonly known as:  ZOFRAN  Take 1 tablet (4 mg total) by mouth every 8 (eight) hours as needed for nausea or vomiting.     oxyCODONE-acetaminophen 10-325 MG per tablet  Commonly known as:  PERCOCET  Take 1 tablet by mouth every 4 (four) hours as needed for pain.     QUEtiapine 100 MG tablet  Commonly known as:  SEROQUEL  Take 100 mg by mouth at bedtime.        Diagnostic Studies:  Dg Thoracic Spine 2 View  05/04/2014   CLINICAL DATA:  T7-9 spinal stimulator.  EXAM: DG C-ARM 61-120 MIN; THORACIC SPINE - 2 VIEW  COMPARISON:  None  FINDINGS: AP and lateral views of the mid thoracic spine were demonstrated. Spinal stimulator is present. No definite acute abnormality.  IMPRESSION: Spinal stimulator present on AP and lateral views of the thoracic spine.   Electronically Signed   By: Annia Beltrew  Davis M.D.   On: 05/04/2014 15:26   Dg C-arm 61-120 Min  05/04/2014   CLINICAL DATA:  T7-9 spinal stimulator.  EXAM: DG C-ARM 61-120 MIN; THORACIC SPINE - 2 VIEW  COMPARISON:  None  FINDINGS: AP and lateral views of the mid thoracic spine were demonstrated. Spinal stimulator is present. No  definite acute abnormality.  IMPRESSION: Spinal stimulator present on AP and lateral views of the thoracic spine.   Electronically Signed   By: Annia Beltrew  Davis M.D.   On: 05/04/2014 15:26          Follow-up Information    Follow up with Venita LickBROOKS,Ardenia Stiner D, MD. Schedule an appointment as soon as possible for a visit in 2 weeks.   Specialty:  Orthopedic Surgery   Why:  For wound re-check, If symptoms worsen   Contact information:   [address] [place] [id number] [phone]       Discharge Plan:  discharge to home  Disposition: doing well.  Tolerating po diet.  SCS functioning well.  F/U in 2 weeks for wound check.    Signed: Venita LickBROOKS,Elaya Droege D for Dr. Venita Lickahari Reneshia Zuccaro [facility] [phone] 05/18/2014, 1:58 PM

## 2016-01-12 IMAGING — RF DG C-ARM 61-120 MIN
1 series · 2 of 2 positions shown · non-contrast
Comparison: None

CLINICAL DATA: T7-9 spinal stimulator.

EXAM:
DG C-ARM 61-120 MIN; THORACIC SPINE - 2 VIEW

[Series 1: run · 2 of 2 slices shown]
[im 1/2]
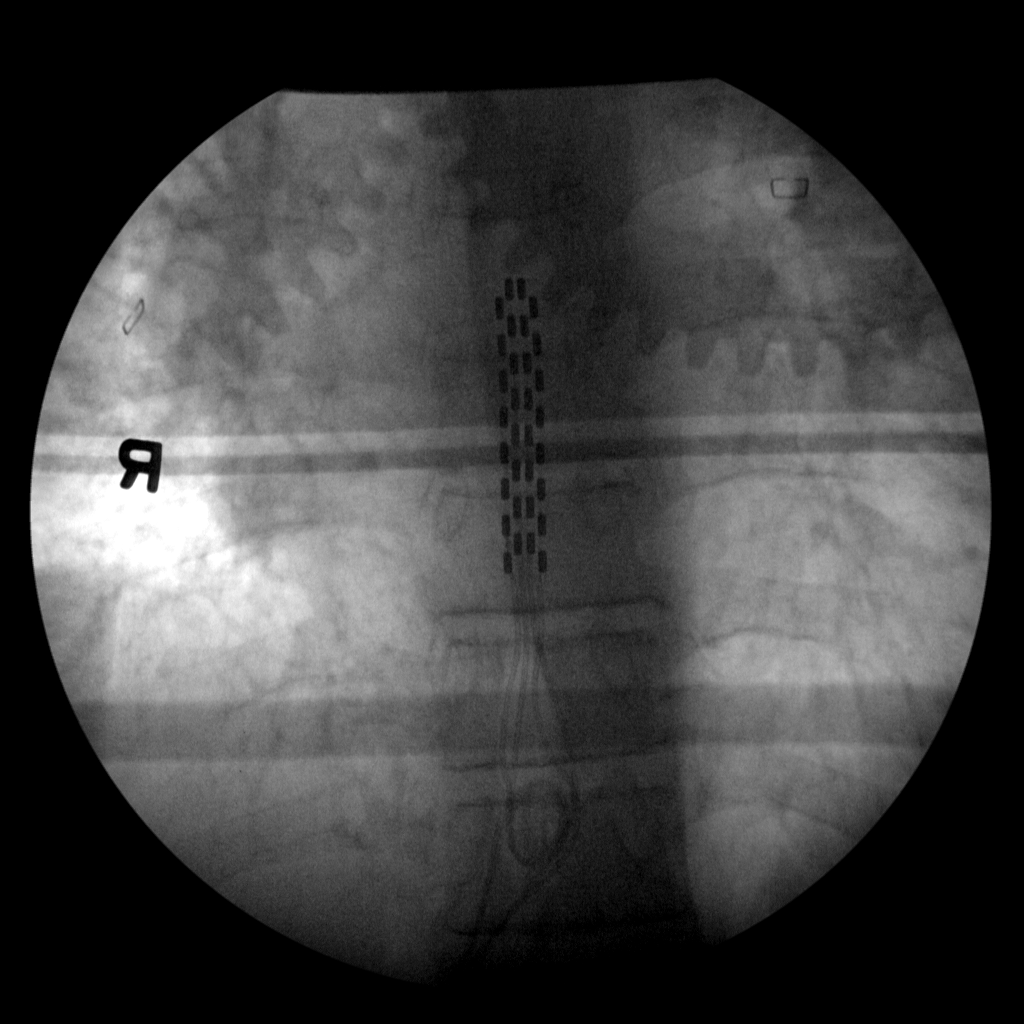
[im 2/2]
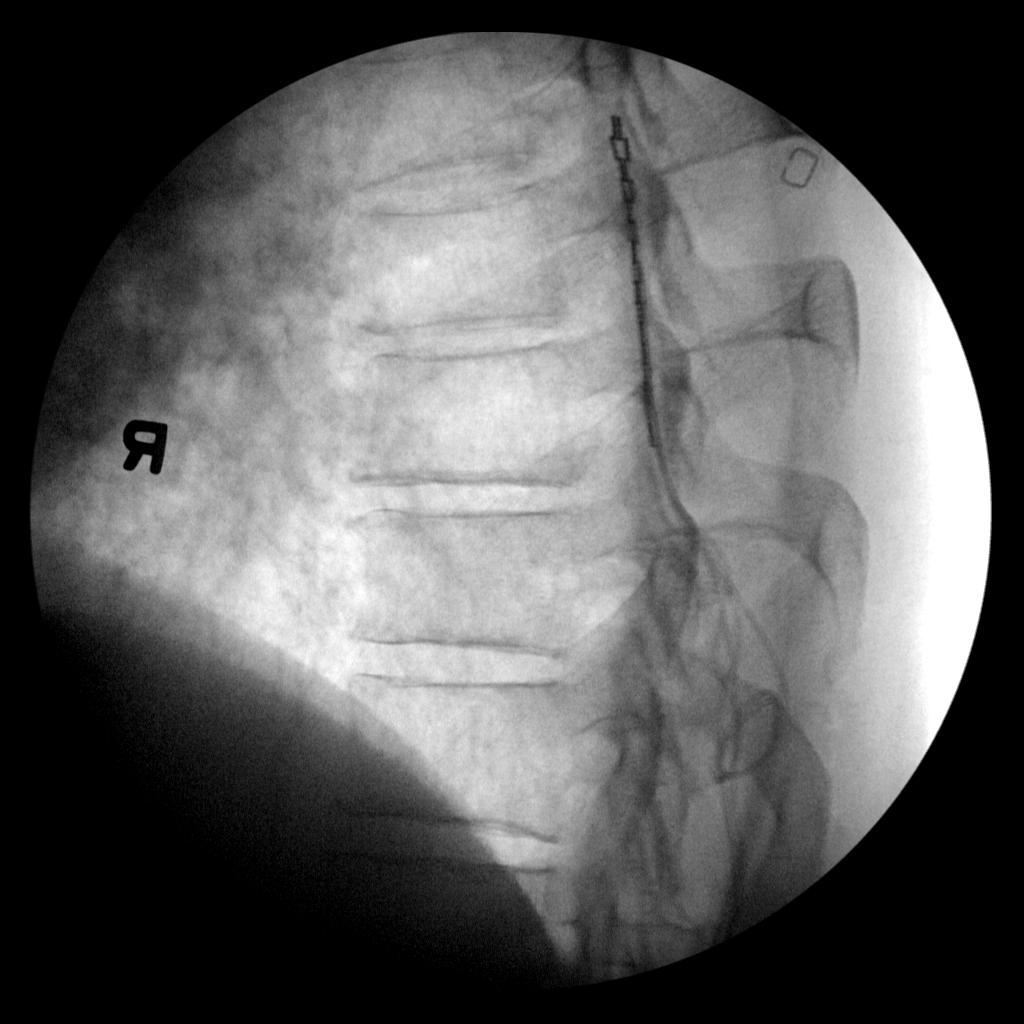

[2 of 2 positions shown; findings below may reference images not displayed]

FINDINGS: AP and lateral views of the mid thoracic spine were demonstrated.
Spinal stimulator is present. No definite acute abnormality.
IMPRESSION: Spinal stimulator present on AP and lateral views of the thoracic
spine.

## 2021-06-17 DEATH — deceased
